# Patient Record
Sex: Female | Born: 1975 | Race: White | Hispanic: No | Smoking: Never smoker
Health system: Southern US, Community
[De-identification: ages and names within clinical notes are randomized; demographics above are authoritative.]

## PROBLEM LIST (undated history)

## (undated) DIAGNOSIS — F988 Other specified behavioral and emotional disorders with onset usually occurring in childhood and adolescence: Secondary | ICD-10-CM

## (undated) DIAGNOSIS — F419 Anxiety disorder, unspecified: Secondary | ICD-10-CM

## (undated) DIAGNOSIS — E039 Hypothyroidism, unspecified: Secondary | ICD-10-CM

## (undated) DIAGNOSIS — R519 Headache, unspecified: Secondary | ICD-10-CM

## (undated) DIAGNOSIS — K219 Gastro-esophageal reflux disease without esophagitis: Secondary | ICD-10-CM

## (undated) DIAGNOSIS — R51 Headache: Secondary | ICD-10-CM

## (undated) HISTORY — PX: OTHER SURGICAL HISTORY: SHX169

## (undated) HISTORY — DX: Headache, unspecified: R51.9

## (undated) HISTORY — DX: Hypothyroidism, unspecified: E03.9

## (undated) HISTORY — DX: Headache: R51

## (undated) HISTORY — DX: Anxiety disorder, unspecified: F41.9

## (undated) HISTORY — DX: Gastro-esophageal reflux disease without esophagitis: K21.9

## (undated) HISTORY — DX: Other specified behavioral and emotional disorders with onset usually occurring in childhood and adolescence: F98.8

---

## 2018-04-06 ENCOUNTER — Encounter: Payer: Self-pay | Admitting: Neurology

## 2018-06-19 ENCOUNTER — Ambulatory Visit: Payer: Self-pay | Admitting: Neurology

## 2018-06-25 NOTE — Progress Notes (Signed)
[facility] Clinic Note - Initial Visit   Date: 06/26/18  Sylvia Lopez MRN: [id number] DOB: 1975/11/17   Dear Marge Duncans, PA:  Thank you for your kind referral of Sylvia Lopez for consultation of atypical head pain. Although her history is well known to you, please allow [place] to reiterate it for the purpose of our medical record. The patient was accompanied to the clinic by self.    History of Present Illness: Sylvia Lopez is a 43 y.o. right-handed Caucasian female with hypothyroidism, ADD, anxiety, and migraines presenting for evaluation of scalp paresthesias.    Starting at the age of 22, she began having headaches.  She has constant pain at the vertex of the head, described as burning.  She also has left sided head pain, described as sharp and throbbing.  Pain is constant and occurs daily.  She endorses photophobia and phonophobia.  No nausea or vomiting. She takes ibuprofen and naproxen which she takes several times per week, which provides some relief.  She avoid taking this too often to minimize risk of rebound headaches.   She has not identified any triggers.  She also has numbness/tingling over the face which occurs prior to headache getting bad.  She also complains of fluttering of the eyes, as if her eyes are in spasm.    She has previously has been evaluated by Nani Skillern at [facility] and [place].  She was on Trokendi, fioricet, trigger point injections, none of which provided relief  She had MRI brain performed about 7 -10 years ago which was normal.  Past Medical History:  Diagnosis Date  . ADD (attention deficit disorder)   . Anxiety   . GERD (gastroesophageal reflux disease)   . Headache   . Hypothyroidism     Past Surgical History:  Procedure Laterality Date  . none       Medications:  Outpatient Encounter Medications as of 06/26/2018  Medication Sig  . levothyroxine (SYNTHROID, LEVOTHROID) 75 MCG  tablet    No facility-administered encounter medications on file as of 06/26/2018.      Allergies: No Known Allergies  Family History: Family History  Problem Relation Age of Onset  . Heart failure Mother   . Diabetes Mother   . Dementia Mother   . Bipolar disorder Mother   . Diabetes Father   . High blood pressure Father   . Cirrhosis Father     Social History: Social History   Tobacco Use  . Smoking status: Never Smoker  . Smokeless tobacco: Never Used  Substance Use Topics  . Alcohol use: Yes    Comment: Occasionally  . Drug use: Never   Social History   Social History Narrative   Lives with boyfriend and 2 of her children.  Works in Health and safety inspector.  Education: some college.    Review of Systems:  CONSTITUTIONAL: No fevers, chills, night sweats, or weight loss.   EYES: No visual changes or eye pain ENT: No hearing changes.  No history of nose bleeds.   RESPIRATORY: No cough, wheezing and shortness of breath.   CARDIOVASCULAR: Negative for chest pain, and palpitations.   GI: Negative for abdominal discomfort, blood in stools or black stools.  No recent change in bowel habits.   GU:  No history of incontinence.   MUSCLOSKELETAL: No history of joint pain or swelling.  No myalgias.   SKIN: Negative for lesions, rash, and itching.   HEMATOLOGY/ONCOLOGY: Negative for prolonged bleeding, bruising  easily, and swollen nodes.  No history of cancer.   ENDOCRINE: Negative for cold or heat intolerance, polydipsia or goiter.   PSYCH:  No depression +anxiety symptoms.   NEURO: As Above.   Vital Signs:  BP 120/88   Pulse 90   Ht 5' 4.5" (1.638 m)   Wt 213 lb 4 oz (96.7 kg)   SpO2 98%   BMI 36.04 kg/m    General Medical Exam:   General:  Well appearing, comfortable.   Eyes/ENT: see cranial nerve examination.   Neck: No masses appreciated.  Full range of motion without tenderness.  No carotid bruits. Respiratory:  Clear to auscultation, good air entry  bilaterally.   Cardiac:  Regular rate and rhythm, no murmur.   Extremities:  No deformities, edema, or skin discoloration.  Skin:  No rashes or lesions.  Neurological Exam: MENTAL STATUS including orientation to time, place, person, recent and remote memory, attention span and concentration, language, and fund of knowledge is normal.  Speech is not dysarthric.  CRANIAL NERVES: II:  No visual field defects.  Unremarkable fundi.   III-IV-VI: Pupils equal round and reactive to light.  Normal conjugate, extra-ocular eye movements in all directions of gaze.  No nystagmus.  No ptosis.   V:  Normal facial sensation   VII:  Normal facial symmetry and movements.   VIII:  Normal hearing and vestibular function.   IX-X:  Normal palatal movement.   XI:  Normal shoulder shrug and head rotation.   XII:  Normal tongue strength and range of motion, no deviation or fasciculation.  MOTOR:  No atrophy, fasciculations or abnormal movements.  No pronator drift.  Tone is normal.    Right Upper Extremity:    Left Upper Extremity:    Deltoid  5/5   Deltoid  5/5   Biceps  5/5   Biceps  5/5   Triceps  5/5   Triceps  5/5   Wrist extensors  5/5   Wrist extensors  5/5   Wrist flexors  5/5   Wrist flexors  5/5   Finger extensors  5/5   Finger extensors  5/5   Finger flexors  5/5   Finger flexors  5/5   Dorsal interossei  5/5   Dorsal interossei  5/5   Abductor pollicis  5/5   Abductor pollicis  5/5   Tone (Ashworth scale)  0  Tone (Ashworth scale)  0   Right Lower Extremity:    Left Lower Extremity:    Hip flexors  5/5   Hip flexors  5/5   Hip extensors  5/5   Hip extensors  5/5   Knee flexors  5/5   Knee flexors  5/5   Knee extensors  5/5   Knee extensors  5/5   Dorsiflexors  5/5   Dorsiflexors  5/5   Plantarflexors  5/5   Plantarflexors  5/5   Toe extensors  5/5   Toe extensors  5/5   Toe flexors  5/5   Toe flexors  5/5   Tone (Ashworth scale)  0  Tone (Ashworth scale)  0   MSRs:   Right  Left brachioradialis 3+  brachioradialis 3+  biceps 3+  biceps 3+  triceps 3+  triceps 3+  patellar 3+  patellar 3+  ankle jerk 2+  ankle jerk 2+  Hoffman no  Hoffman no  plantar response down  plantar response down   SENSORY:  Normal and symmetric perception of light touch, pinprick, vibration, and proprioception.  Romberg's sign absent.   COORDINATION/GAIT: Normal finger-to- nose-finger and heel-to-shin.  Intact rapid alternating movements bilaterally.  Able to rise from a chair without using arms.  Gait narrow based and stable. Tandem and stressed gait intact.    IMPRESSION: Chronic migraine headaches with facial/scalp paresthesias.  Her neurological exam shows symmetric and brisk reflexes which in the absence of other associated UMN findings, is most likely normal for patient.  With her myriad of symptoms, MRI brain wwo contrast will be ordered to be sure demyelinating disease has been evaluated for, however my overall suspicion is low.  For her headaches, start nortriptyline 65m at bedtime for 2 week, then increase to 2 tablet at bedtime Limit all OTC medications to twice per week, which she is compliant with  Return to clinic in 4 months.  Thank you for allowing me to participate in patient's care.  If I can answer any additional questions, I would be pleased to do so.    Sincerely,    Donika K. PPosey Pronto DO

## 2018-06-26 ENCOUNTER — Encounter: Payer: Self-pay | Admitting: Neurology

## 2018-06-26 ENCOUNTER — Ambulatory Visit (INDEPENDENT_AMBULATORY_CARE_PROVIDER_SITE_OTHER): Payer: Managed Care, Other (non HMO) | Admitting: Neurology

## 2018-06-26 VITALS — BP 120/88 | HR 90 | Ht 64.5 in | Wt 213.2 lb

## 2018-06-26 DIAGNOSIS — R51 Headache: Secondary | ICD-10-CM

## 2018-06-26 DIAGNOSIS — R209 Unspecified disturbances of skin sensation: Secondary | ICD-10-CM

## 2018-06-26 DIAGNOSIS — R292 Abnormal reflex: Secondary | ICD-10-CM

## 2018-06-26 DIAGNOSIS — R519 Headache, unspecified: Secondary | ICD-10-CM | POA: Insufficient documentation

## 2018-06-26 DIAGNOSIS — R202 Paresthesia of skin: Secondary | ICD-10-CM

## 2018-06-26 NOTE — Patient Instructions (Addendum)
Start nortriptyline 10mg  at bedtime for 2 week, then increase to 2 tablet at bedtime  MRI brain without contrast  Return to clinic in 4 months

## 2018-06-30 ENCOUNTER — Telehealth: Payer: Self-pay | Admitting: Neurology

## 2018-06-30 ENCOUNTER — Other Ambulatory Visit: Payer: Self-pay | Admitting: *Deleted

## 2018-06-30 MED ORDER — NORTRIPTYLINE HCL 10 MG PO CAPS: ORAL_CAPSULE | ORAL | 5 refills | Status: DC

## 2018-06-30 NOTE — Telephone Encounter (Signed)
Patient states that the [facility] in [place] did not receive the RX we were to sent Friday and would like [place] to resend it

## 2018-06-30 NOTE — Telephone Encounter (Signed)
Rx sent again

## 2018-08-10 ENCOUNTER — Ambulatory Visit
Admission: RE | Admit: 2018-08-10 | Discharge: 2018-08-10 | Disposition: A | Discharge status: Home / Self Care | Payer: Managed Care, Other (non HMO) | Source: Ambulatory Visit | Attending: Neurology | Admitting: Neurology

## 2018-08-10 DIAGNOSIS — R209 Unspecified disturbances of skin sensation: Secondary | ICD-10-CM

## 2018-08-10 DIAGNOSIS — R51 Headache: Principal | ICD-10-CM

## 2018-08-10 DIAGNOSIS — R202 Paresthesia of skin: Secondary | ICD-10-CM

## 2018-08-10 DIAGNOSIS — R519 Headache, unspecified: Secondary | ICD-10-CM

## 2018-08-11 ENCOUNTER — Telehealth: Payer: Self-pay | Admitting: *Deleted

## 2018-08-11 NOTE — Telephone Encounter (Signed)
-----   Message from Glendale Chard, DO sent at 08/10/2018  9:05 AM EST ----- Please inform patient that her MRI brain is normal.  Nothing worrisome causing her headaches or numbness/tingling. Thanks.

## 2018-08-11 NOTE — Telephone Encounter (Signed)
Left message giving patient results.  

## 2018-10-08 ENCOUNTER — Other Ambulatory Visit: Payer: Self-pay | Admitting: Neurology

## 2020-06-07 IMAGING — MR MR HEAD W/O CM
10 series · 46 of 48 positions shown · non-contrast
Comparison: None.

CLINICAL DATA: Chronic daily headache.  Facial paresthesia

EXAM:
MRI HEAD WITHOUT CONTRAST
TECHNIQUE: Multiplanar, multiecho pulse sequences of the brain and surrounding
structures were obtained without intravenous contrast.

[Series 2: T1 · sagittal · 5.0mm · 0.45mm/px · 2 of 21 slices shown]
[im 1/21]
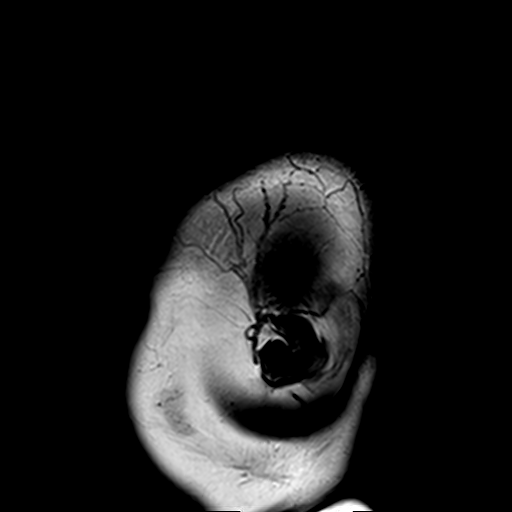
[im 21/21]
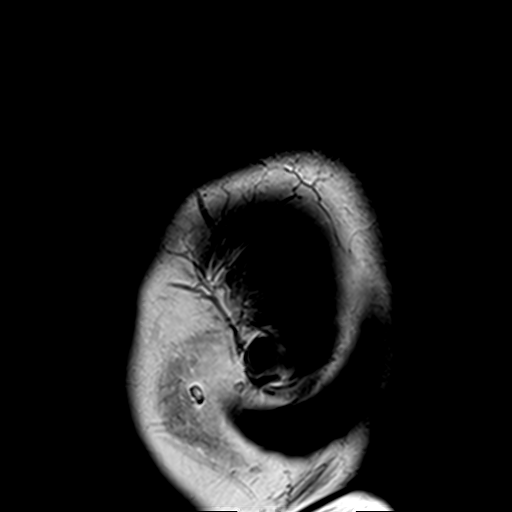

[Series 3: DWI · axial · 3.0mm · 1.80mm/px · z∈[-60,+87]mm · 8 of 100 slices shown (1 of 4)]
[im 1/100]
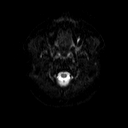
[im 15/100]
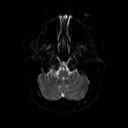
[im 29/100]
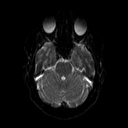
[im 43/100]
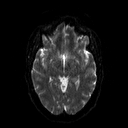
[im 57/100]
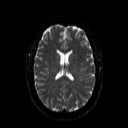
[im 71/100]
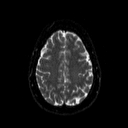
[im 85/100]
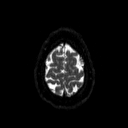
[im 100/100]
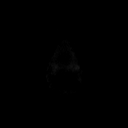

[Series 4: DWI · axial · 3.0mm · 1.80mm/px · z∈[-60,+87]mm · 4 of 50 slices shown (2 of 4)]
[im 1/50]
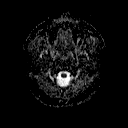
[im 17/50]
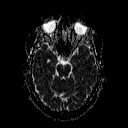
[im 33/50]
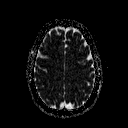
[im 50/50]
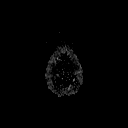

[Series 5: DWI · coronal · 5.0mm · 1.80mm/px · 5 of 68 slices shown (3 of 4)]
[im 1/68]
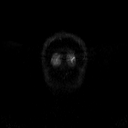
[im 17/68]
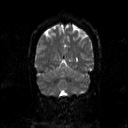
[im 34/68]
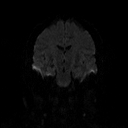
[im 51/68]
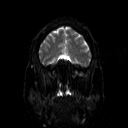
[im 68/68]
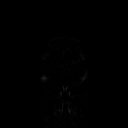

[Series 6: DWI · coronal · 5.0mm · 1.80mm/px · 3 of 34 slices shown (4 of 4)]
[im 1/34]
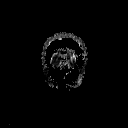
[im 17/34]
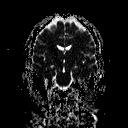
[im 34/34]
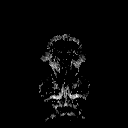

[Series 8: swi_images · axial · 2.0mm · 0.90mm/px · z∈[-65,+93]mm · 6 of 80 slices shown]
[im 1/80]
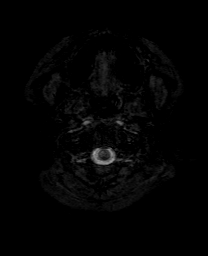
[im 16/80]
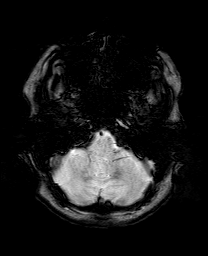
[im 32/80]
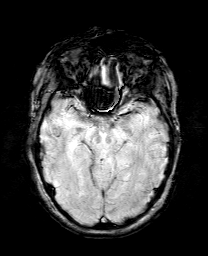
[im 48/80]
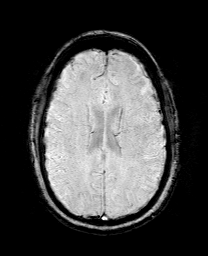
[im 64/80]
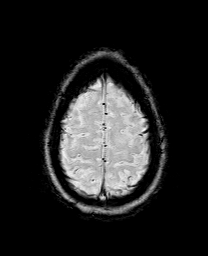
[im 80/80]
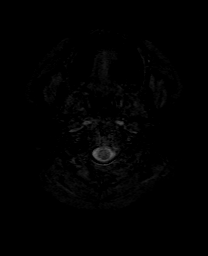

[Series 9: T2 · axial · 5.0mm · 0.51mm/px · z∈[-62,+86]mm · 2 of 23 slices shown (1 of 2)]
[im 1/23]
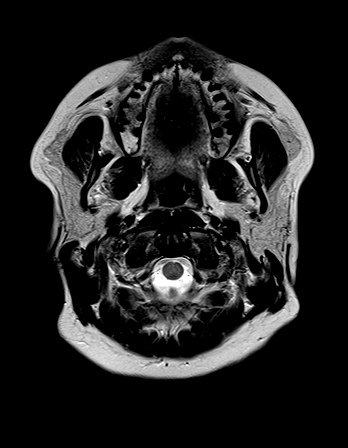
[im 23/23]
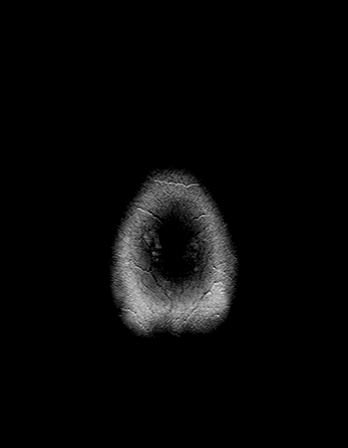

[Series 10: FLAIR · axial · 3.0mm · 0.45mm/px · z∈[-64,+89]mm · 3 of 35 slices shown]
[im 1/35]
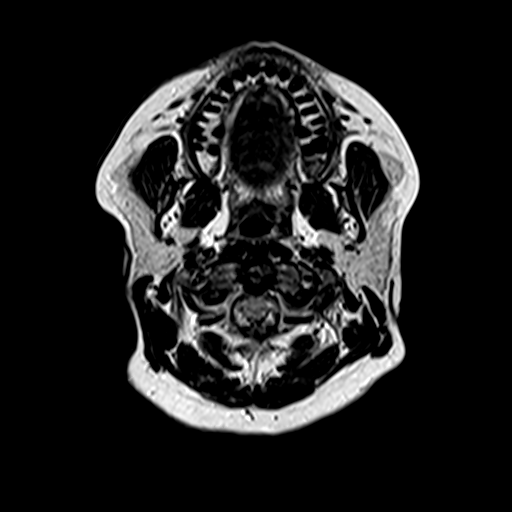
[im 18/35]
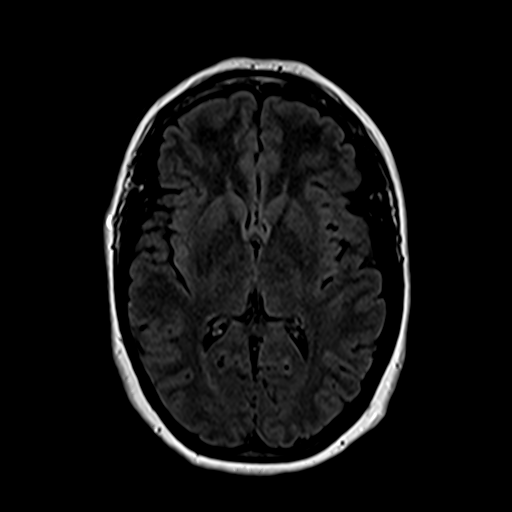
[im 35/35]
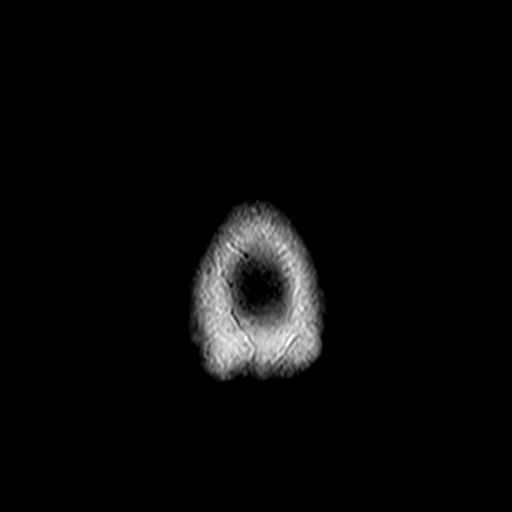

[Series 11: t1_mpr_tra copy center · axial · 1.0mm · 0.45mm/px · z∈[-68,+91]mm · 11 of 160 slices shown]
[im 1/160]
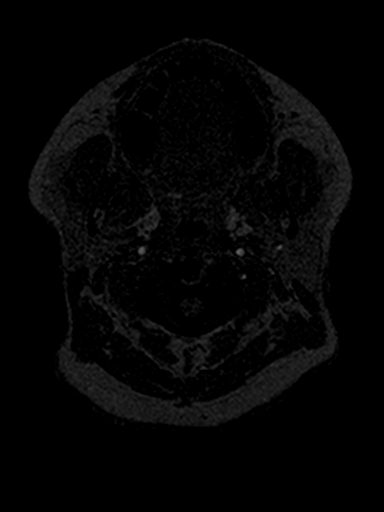
[im 14/160]
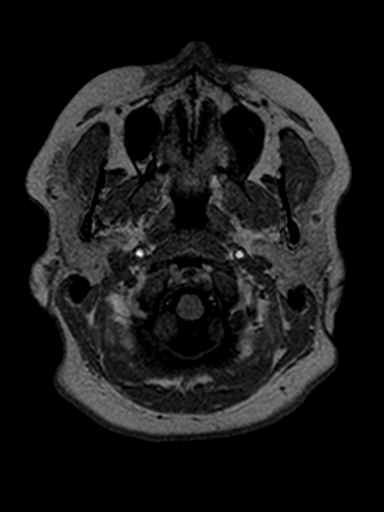
[im 27/160]
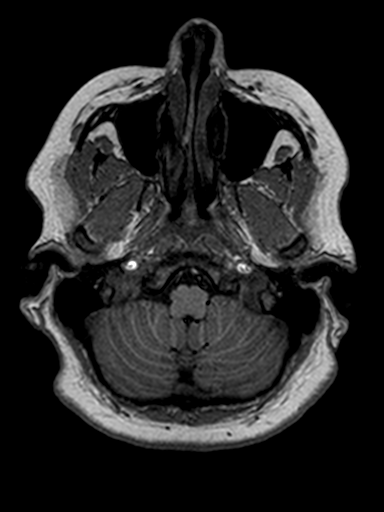
[im 40/160]
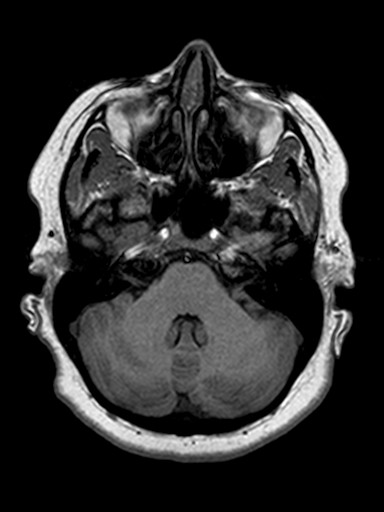
[im 54/160]
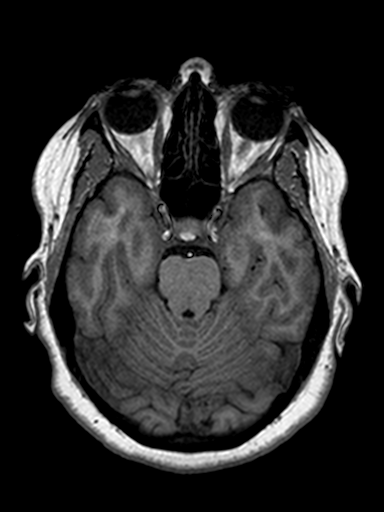
[im 67/160]
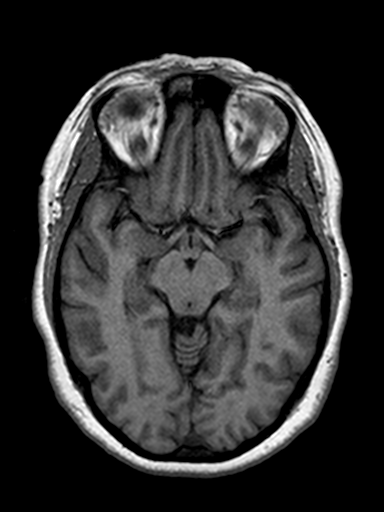
[im 80/160]
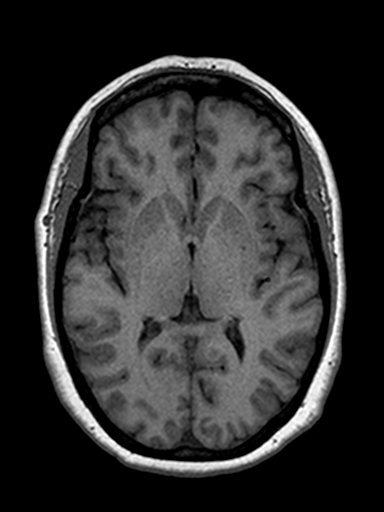
[im 93/160]
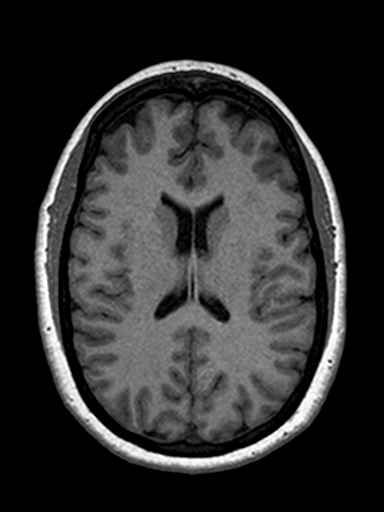
[im 107/160]
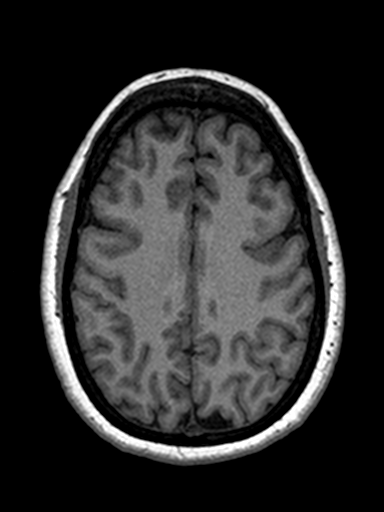
[im 133/160]
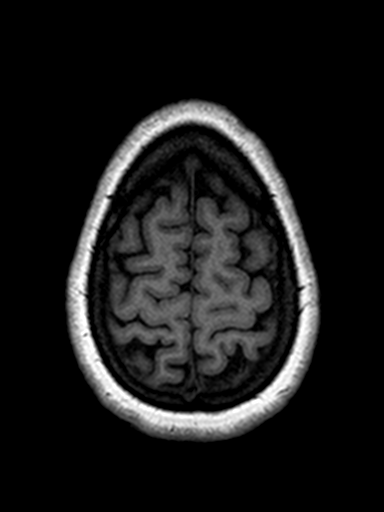
[im 160/160]
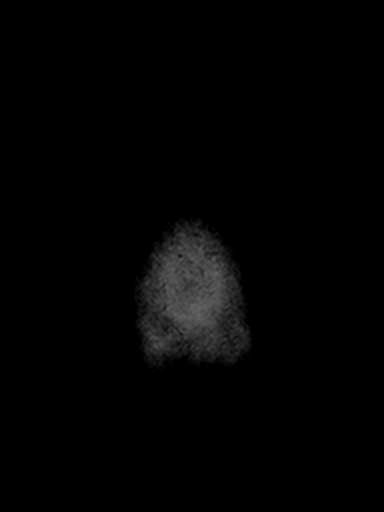

[Series 12: T2 · coronal · 5.0mm · 0.45mm/px · 2 of 27 slices shown (2 of 2)]
[im 1/27]
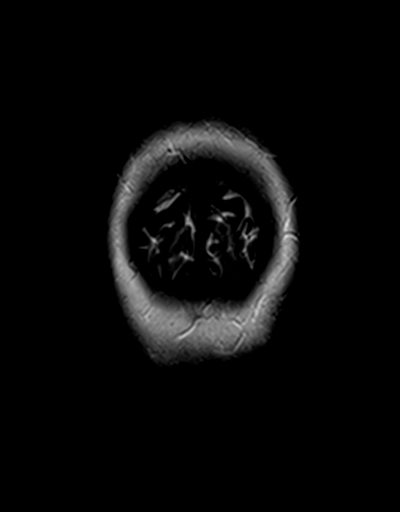
[im 27/27]
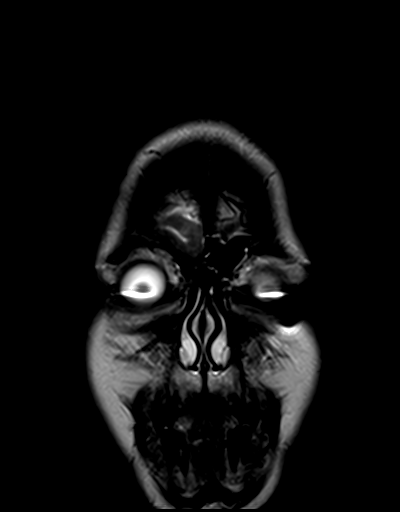

[46 of 48 positions shown; findings below may reference images not displayed]

FINDINGS: Brain: No acute infarction, hemorrhage, hydrocephalus, extra-axial
collection or mass lesion.

Vascular: Normal arterial flow voids

Skull and upper cervical spine: Negative

Sinuses/Orbits: Mild mucosal edema paranasal sinuses.  Normal orbit

Other: None
IMPRESSION: Negative MRI head without contrast.

## 2023-09-17 ENCOUNTER — Ambulatory Visit (INDEPENDENT_AMBULATORY_CARE_PROVIDER_SITE_OTHER): Admitting: Podiatry

## 2023-09-17 ENCOUNTER — Ambulatory Visit (INDEPENDENT_AMBULATORY_CARE_PROVIDER_SITE_OTHER)

## 2023-09-17 DIAGNOSIS — M7732 Calcaneal spur, left foot: Secondary | ICD-10-CM

## 2023-09-17 DIAGNOSIS — M778 Other enthesopathies, not elsewhere classified: Secondary | ICD-10-CM

## 2023-09-17 DIAGNOSIS — M722 Plantar fascial fibromatosis: Secondary | ICD-10-CM

## 2023-09-17 DIAGNOSIS — M7752 Other enthesopathy of left foot: Secondary | ICD-10-CM

## 2023-09-17 MED ORDER — MELOXICAM 15 MG PO TABS
15.0000 mg | ORAL_TABLET | Freq: Every day | ORAL | 1 refills | Status: DC
Start: 2023-09-17 — End: 2023-11-14

## 2023-09-17 NOTE — Progress Notes (Signed)
 Chief Complaint  Patient presents with   Foot Pain    Left foot pain in the heel but has spread to ankle, and arch. This is an ongoing issue for years. It feels like it is going to explode. Has the PF hopp going from rest to standing.  Not diabetic no anti coag.    HPI: 48 y.o. female presenting today with c/o pain in the bottom of the left heel.  Pain is rated as 9/10.  Patient states that she has had this for a long time/chronic issue.  Has pain with for steps out of bed in the morning.  This also gets aggravated with prolonged weightbearing and walking.  States that this flared up recently during a trip to [place] and her pain is much worse now.  Denies trauma or bruising.  Past Medical History:  Diagnosis Date   ADD (attention deficit disorder)    Anxiety    GERD (gastroesophageal reflux disease)    Headache    Hypothyroidism     Past Surgical History:  Procedure Laterality Date   none      No Known Allergies   Physical Exam: General: The patient is alert and oriented x3 in no acute distress.  Dermatology:  No ecchymosis, erythema, or edema bilateral.  No open lesions.    Vascular: Palpable pedal pulses bilaterally. Capillary refill within normal limits.  No appreciable edema.    Neurological: Light touch sensation intact bilateral.  MMT 5/5 to lower extremity bilateral. Negative Tinel's sign with percussion of the posterior tibial nerve on the affected extremity.    Musculoskeletal Exam:  There is pain on palpation of the plantarmedial & plantarcentral aspect of left heel.  No gaps or nodules within the plantar fascia.  Positive Windlass mechanism bilateral.  Antalgic gait noted with first few steps upon standing.  No pain on palpation of achilles tendon bilateral.  Ankle df less than 10 degrees with knee extended b/l.  Radiographic Exam (left foot, 3 weightbearing views, 09/17/2023):  Normal osseous mineralization. Joint spaces preserved.  No fracture noted.   Moderate inferior calcaneal spur noted.  Assessment/Plan of Care: 1. Plantar fasciitis of left foot   2. Capsulitis of left foot   3. Bursitis of left foot   4. Inferior calcaneal spur of left foot     Meds ordered this encounter  Medications   meloxicam (MOBIC) 15 MG tablet    Sig: Take 1 tablet (15 mg total) by mouth daily.    Dispense:  30 tablet    Refill:  1   FOR HOME USE ONLY DME NIGHT SPLINT  -Reviewed etiology of plantar fasciitis with patient.  Discussed treatment options with patient today, including cortisone injection, NSAID course of treatment, stretching exercises, physical therapy, use of night splint, rest, icing the heel, arch supports/orthotics, and supportive shoe gear.    With the patient's verbal consent, a corticosteroid injection was administered to the left heel, consisting of a mixture of 1% lidocaine plain, 0.5% Sensorcaine plain, and Kenalog-10 for a total of 1.5cc administered.  A Band-aid was applied. Pain level post-injection is 4/10.  Patient was fitted and dispensed a night splint which is a static AFO to be worn when nonweightbearing/sleeping.  This has a soft interface material.  An Soil scientist was reviewed by the medical assistant with the patient, and signed by the patient today.  Patient given stretching exercises to perform daily.  Prescription for meloxicam 15 mg 1 tab p.o. daily  sent to her pharmacy.  Return in about 4 weeks (around 10/15/2023) for f/u plantar fasciitis.   Joe Murders, DPM, FACFAS [facility]     [address] [place], [place] [address]                Office [phone]  Fax [phone]

## 2023-09-17 NOTE — Patient Instructions (Signed)

## 2023-10-15 ENCOUNTER — Encounter: Admitting: Podiatry

## 2023-10-15 NOTE — Progress Notes (Signed)
Patient did not show for scheduled appointment today.

## 2023-11-14 ENCOUNTER — Other Ambulatory Visit: Payer: Self-pay | Admitting: Podiatry
